# Patient Record
Sex: Female | Born: 1990 | Race: White | Hispanic: No | Marital: Single
Health system: Southern US, Community
[De-identification: ages and names within clinical notes are randomized; demographics above are authoritative.]

## PROBLEM LIST (undated history)

## (undated) DIAGNOSIS — R55 Syncope and collapse: Secondary | ICD-10-CM

---

## 2014-10-19 ENCOUNTER — Emergency Department: Payer: 59

## 2014-10-19 ENCOUNTER — Encounter: Payer: Self-pay | Admitting: Emergency Medicine

## 2014-10-19 ENCOUNTER — Emergency Department
Admission: EM | Admit: 2014-10-19 | Discharge: 2014-10-19 | Disposition: A | Discharge status: Home / Self Care | Payer: 59 | Attending: Emergency Medicine | Admitting: Emergency Medicine

## 2014-10-19 DIAGNOSIS — S161XXA Strain of muscle, fascia and tendon at neck level, initial encounter: Secondary | ICD-10-CM | POA: Diagnosis not present

## 2014-10-19 DIAGNOSIS — S3991XA Unspecified injury of abdomen, initial encounter: Secondary | ICD-10-CM | POA: Insufficient documentation

## 2014-10-19 DIAGNOSIS — S199XXA Unspecified injury of neck, initial encounter: Secondary | ICD-10-CM | POA: Diagnosis present

## 2014-10-19 DIAGNOSIS — Y92410 Unspecified street and highway as the place of occurrence of the external cause: Secondary | ICD-10-CM | POA: Diagnosis not present

## 2014-10-19 DIAGNOSIS — Z3202 Encounter for pregnancy test, result negative: Secondary | ICD-10-CM | POA: Diagnosis not present

## 2014-10-19 DIAGNOSIS — Y998 Other external cause status: Secondary | ICD-10-CM | POA: Diagnosis not present

## 2014-10-19 DIAGNOSIS — Y9389 Activity, other specified: Secondary | ICD-10-CM | POA: Insufficient documentation

## 2014-10-19 HISTORY — DX: Syncope and collapse: R55

## 2014-10-19 LAB — POCT PREGNANCY, URINE: PREG TEST UR: NEGATIVE

## 2014-10-19 MED ORDER — HYDROCODONE-ACETAMINOPHEN 5-325 MG PO TABS: 1.0000 | ORAL_TABLET | ORAL | Status: AC | PRN

## 2014-10-19 MED ORDER — DIAZEPAM 2 MG PO TABS: 2.0000 mg | ORAL_TABLET | Freq: Three times a day (TID) | ORAL | Status: AC | PRN
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
## 2014-10-19 NOTE — Discharge Instructions (Signed)
Cervical Sprain A cervical sprain is when the tissues (ligaments) that hold the neck bones in place stretch or tear. HOME CARE   Put ice on the injured area.  Put ice in a plastic bag.  Place a towel between your skin and the bag.  Leave the ice on for 15-20 minutes, 3-4 times a day.  You may have been given a collar to wear. This collar keeps your neck from moving while you heal.  Do not take the collar off unless told by your doctor.  If you have long hair, keep it outside of the collar.  Ask your doctor before changing the position of your collar. You may need to change its position over time to make it more comfortable.  If you are allowed to take off the collar for cleaning or bathing, follow your doctor's instructions on how to do it safely.  Keep your collar clean by wiping it with mild soap and water. Dry it completely. If the collar has removable pads, remove them every 1-2 days to hand wash them with soap and water. Allow them to air dry. They should be dry before you wear them in the collar.  Do not drive while wearing the collar.  Only take medicine as told by your doctor.  Keep all doctor visits as told.  Keep all physical therapy visits as told.  Adjust your work station so that you have good posture while you work.  Avoid positions and activities that make your problems worse.  Warm up and stretch before being active. GET HELP IF:  Your pain is not controlled with medicine.  You cannot take less pain medicine over time as planned.  Your activity level does not improve as expected. GET HELP RIGHT AWAY IF:   You are bleeding.  Your stomach is upset.  You have an allergic reaction to your medicine.  You develop new problems that you cannot explain.  You lose feeling (become numb) or you cannot move any part of your body (paralysis).  You have tingling or weakness in any part of your body.  Your symptoms get worse. Symptoms include:  Pain,  soreness, stiffness, puffiness (swelling), or a burning feeling in your neck.  Pain when your neck is touched.  Shoulder or upper back pain.  Limited ability to move your neck.  Headache.  Dizziness.  Your hands or arms feel week, lose feeling, or tingle.  Muscle spasms.  Difficulty swallowing or chewing. MAKE SURE YOU:   Understand these instructions.  Will watch your condition.  Will get help right away if you are not doing well or get worse. Document Released: 09/09/2007 Document Revised: 11/23/2012 Document Reviewed: 09/28/2012 [facility] [employer] Patient Information 2015 [employer], [place]. This information is not intended to replace advice given to you by your health care provider. Make sure you discuss any questions you have with your health care provider.   FOLLOW UP WITH YOUR DOCTOR OR [facility] IF ANY CONTINUED PROBLEMS  MEDICATION AS DIRECTED ONLY MOIST HEAT OR ICE AS NEEDED
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
## 2014-10-19 NOTE — ED Notes (Signed)
Patient with no complaints at this time. Respirations even and unlabored. Skin warm/dry. Discharge instructions reviewed with patient at this time. Patient given opportunity to voice concerns/ask questions. Patient discharged at this time and left Emergency Department with steady gait.   

## 2016-12-15 IMAGING — CT CT CERVICAL SPINE W/O CM
2 series · 10 of 14 positions shown, 12 images · non-contrast
Comparison: Cervical spine x-ray same day

CLINICAL DATA: Abnormal  x-ray of the cervical spine same day

EXAM:
CT CERVICAL SPINE WITHOUT CONTRAST
TECHNIQUE: Multidetector CT imaging of the cervical spine was performed without
intravenous contrast. Multiplanar CT image reconstructions were also
generated.

[Series 3: c spine soft · axial · 0.29mm/px · z∈[-180,-68]mm · 5 of 85 slices shown]
[im 15/85  soft-tissue]
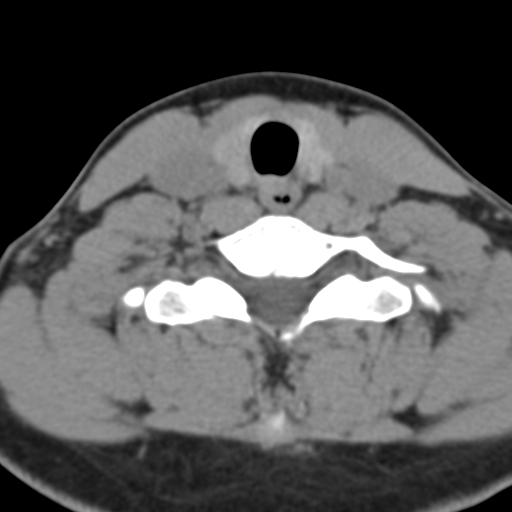
[im 29/85  soft-tissue]
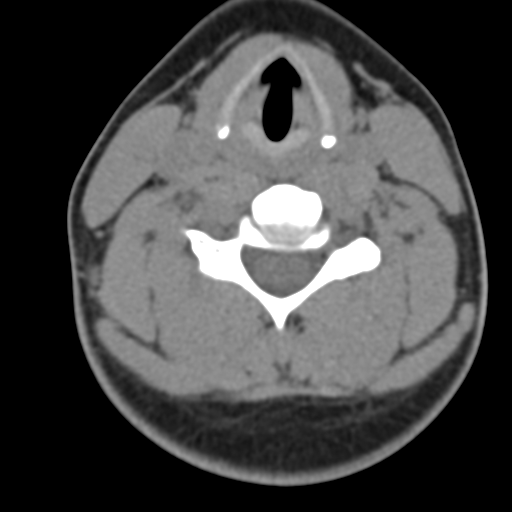
[im 43/85  soft-tissue]
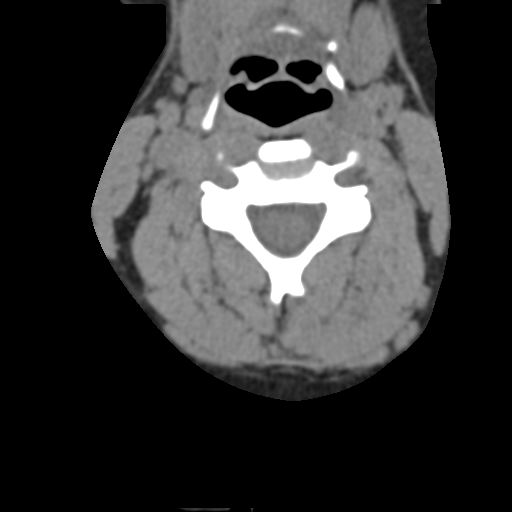
[im 57/85  soft-tissue]
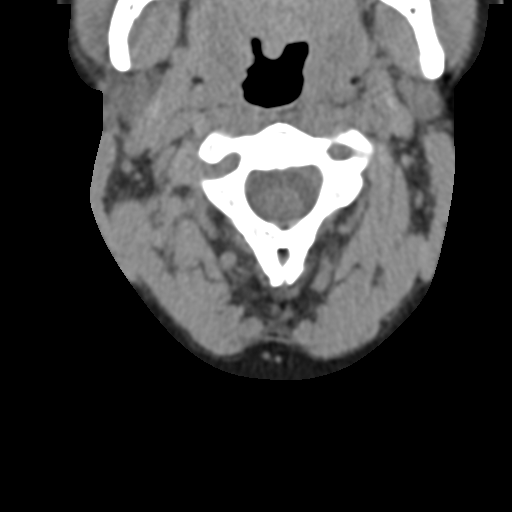
[im 71/85  soft-tissue]
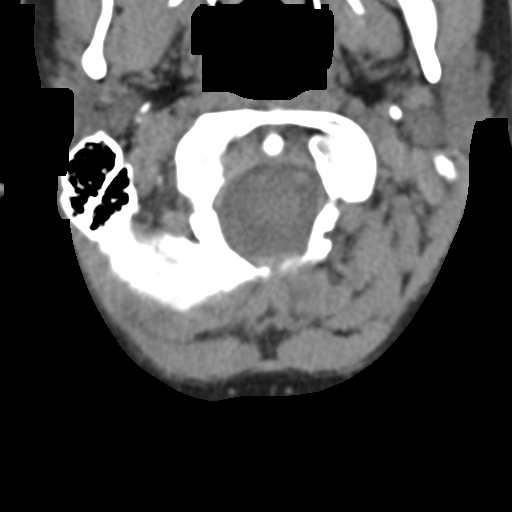

[Series 6: orthogonal axials · axial · 0.24mm/px · z∈[-194,-84]mm · 5 of 87 slices shown, 7 images]
[im 15/87  soft-tissue]
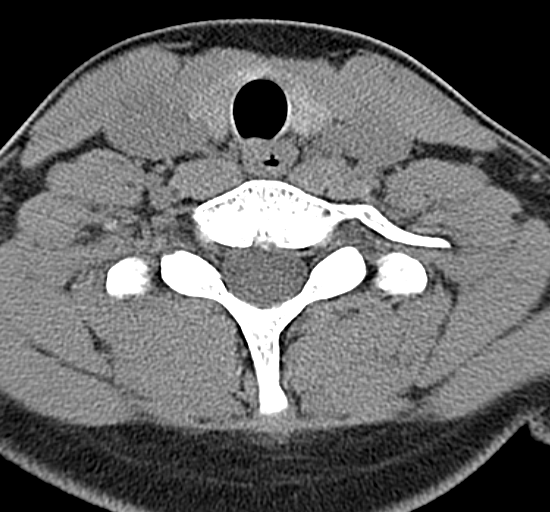
[im 15/87  bone]
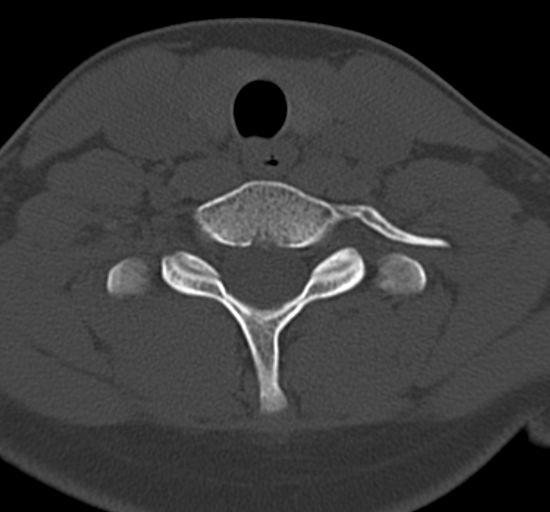
[im 29/87  bone]
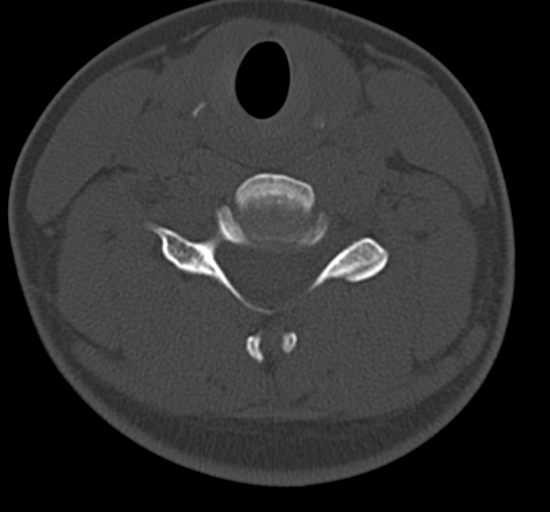
[im 44/87  bone]
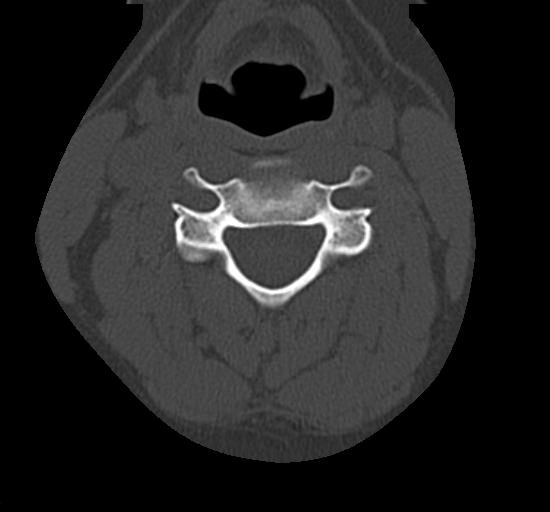
[im 58/87  bone]
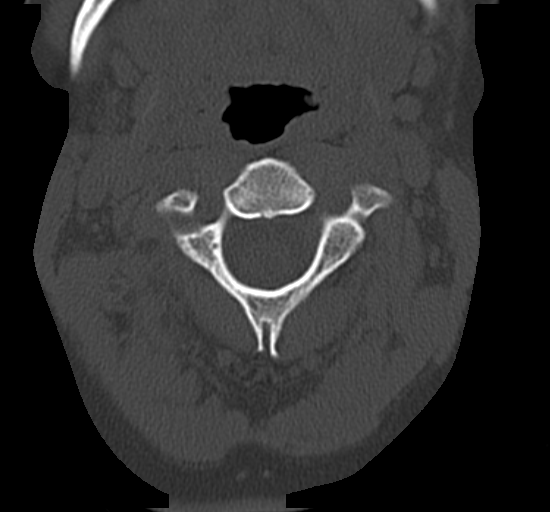
[im 72/87  soft-tissue]
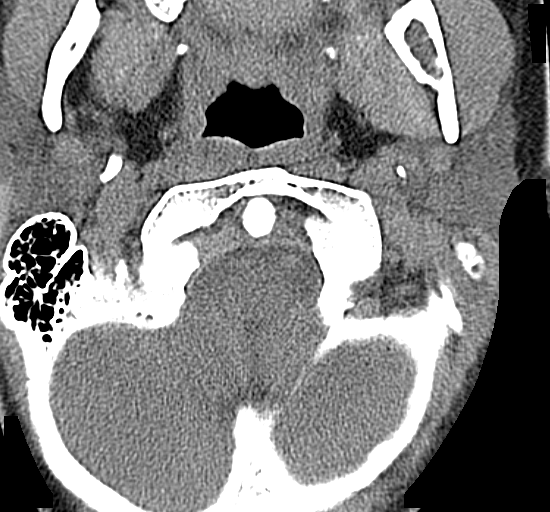
[im 72/87  bone]
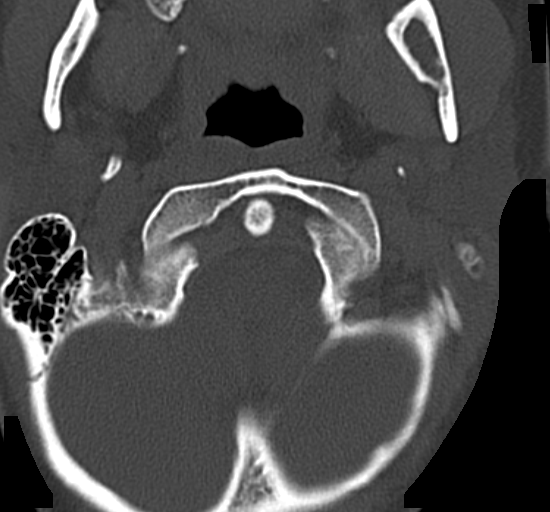

[10 of 14 positions shown; findings below may reference images not displayed]

FINDINGS: Axial images of the cervical spine shows no acute fracture or
subluxation

Computer processed images shows no acute fracture or subluxation.
Sagittal images shows alignment, disc spaces and vertebral body
heights are preserved. No prevertebral soft tissue swelling.
Cervical airway is patent.

As noted on x-ray the left transverse process of the C7 vertebral
body is split, unfused. In axial image 72 the anterior aspect is
well corticated. There is no evidence of acute fracture. This is
most likely is congenital or developmental un- fusion. The spinal
canal is patent. No paraspinal hemorrhage.
IMPRESSION: 1. No acute fracture or subluxation. Alignment, disc spaces and
vertebral body heights are preserved. There is probable congenital
unfused left transverse process of C7 vertebral body.
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
